# Patient Record
Sex: Male | Born: 1990 | Race: Black or African American | Hispanic: No | Marital: Single
Health system: Southern US, Community
[De-identification: ages and names within clinical notes are randomized; demographics above are authoritative.]

## PROBLEM LIST (undated history)

## (undated) ENCOUNTER — Ambulatory Visit (HOSPITAL_COMMUNITY): Discharge status: Absent without leave | Payer: Self-pay

---
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
## 2009-07-20 ENCOUNTER — Emergency Department (HOSPITAL_COMMUNITY): Admission: EM | Admit: 2009-07-20 | Discharge: 2009-07-20 | Payer: Self-pay | Admitting: Family Medicine

## 2009-12-10 IMAGING — CR DG KNEE COMPLETE 4+V*R*
4 series · 4 of 4 positions shown · non-contrast
Comparison: none

CLINICAL DATA: Knee pain status post fall.
 RIGHT KNEE ? 4 VIEWS ? 08/01/07:

[t knee ap right]
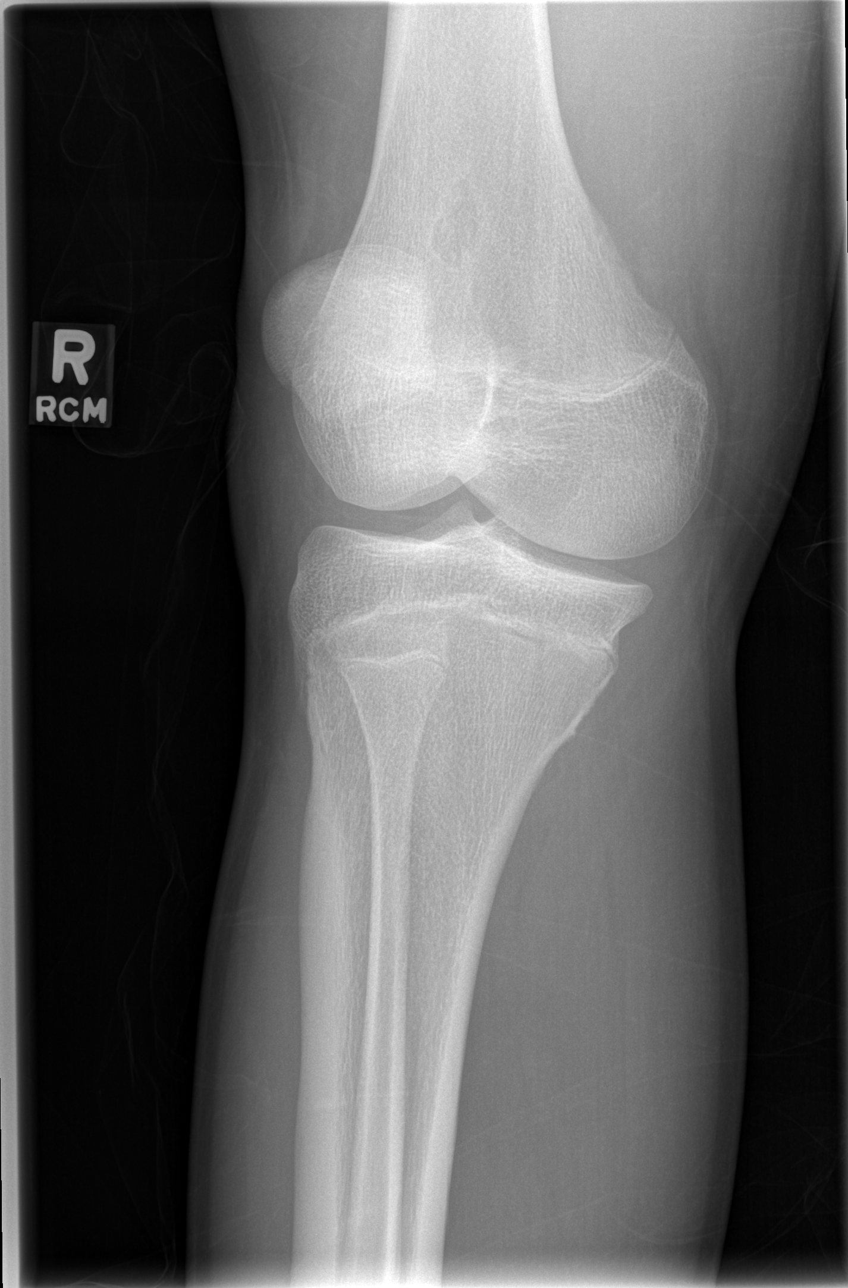

[t knee oblique right (1 of 2)]
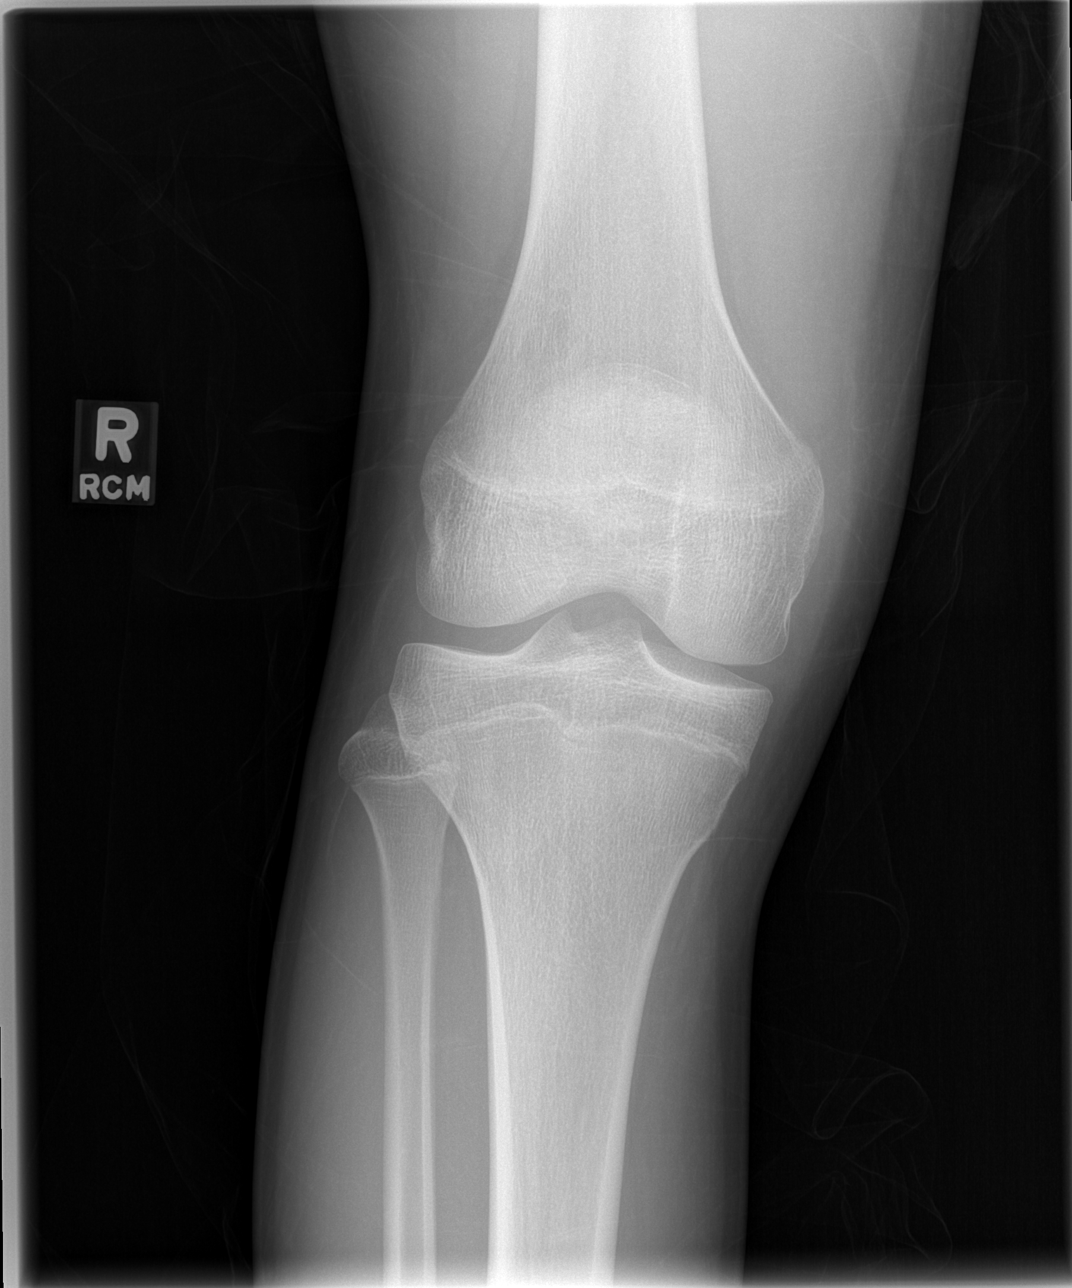

[t knee oblique right (2 of 2)]
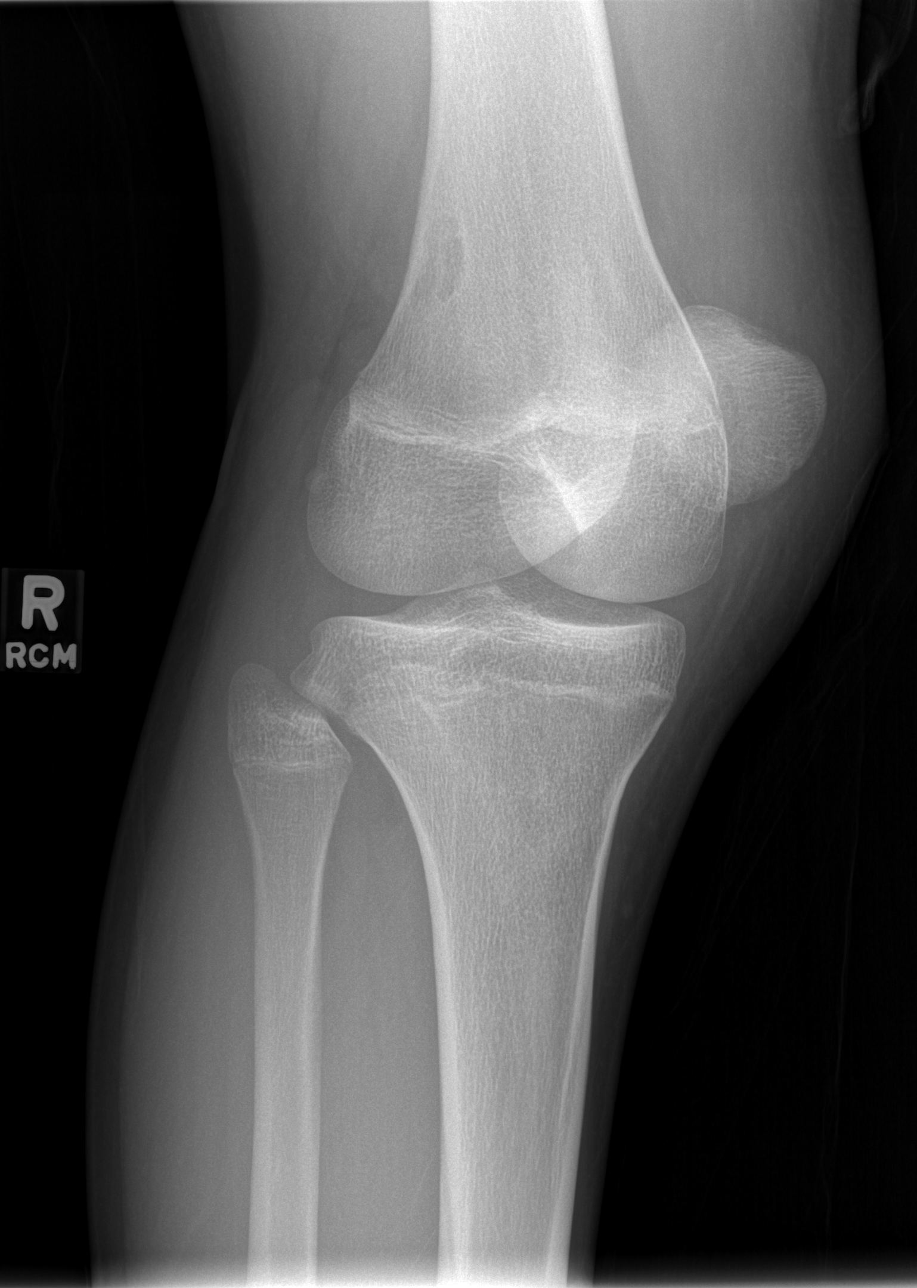

[t knee lat right]
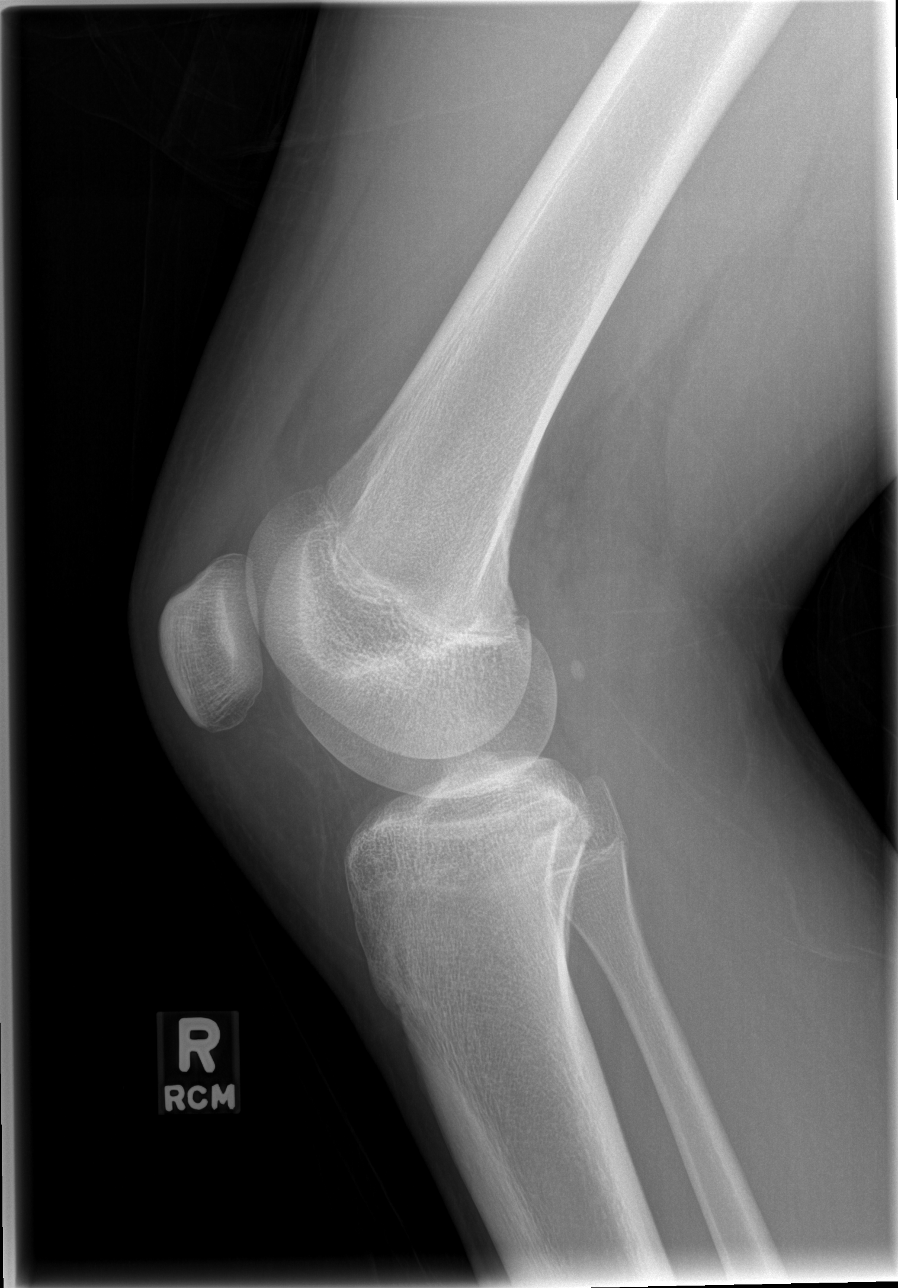

[4 of 4 positions shown; findings below may reference images not displayed]

FINDINGS: There is no evidence of fracture or dislocation.  No other soft tissue or bone abnormalities are identified.  There is no evidence of joint effusion.
IMPRESSION: Negative.

## 2010-08-18 NOTE — Progress Notes (Signed)
Summary: Triage  Phone Note Call from Patient Call back at [phone]   Reason for Call: Talk to Nurse Summary of Call: Is requesting to speak with a nurse about it burning when he urinates. Initial call taken by: Haydee Salter,  September 19, 2007 8:53 AM  Follow-up for Phone Call        appt made for this afternoon. urged him to drink plenty of fluids and may take tyl or ibu for pain Follow-up by: Golden Circle RN,  September 19, 2007 8:56 AM

## 2010-08-18 NOTE — Miscellaneous (Signed)
Summary: dnka/ts  Clinical Lists Changes 

## 2010-10-27 LAB — GC/CHLAMYDIA PROBE AMP, GENITAL
Chlamydia, DNA Probe: NEGATIVE
GC Probe Amp, Genital: NEGATIVE

## 2010-10-28 LAB — GC/CHLAMYDIA PROBE AMP, GENITAL
Chlamydia, DNA Probe: NEGATIVE
GC Probe Amp, Genital: NEGATIVE

## 2011-04-07 LAB — CULTURE, ROUTINE-ABSCESS

## 2011-04-08 LAB — HEPATITIS B SURFACE ANTIGEN: Hepatitis B Surface Ag: NEGATIVE

## 2011-04-08 LAB — GC/CHLAMYDIA PROBE AMP, GENITAL
Chlamydia, DNA Probe: NEGATIVE
GC Probe Amp, Genital: NEGATIVE

## 2011-04-18 LAB — RPR: RPR Ser Ql: NONREACTIVE

## 2011-04-18 LAB — HIV ANTIBODY (ROUTINE TESTING W REFLEX): HIV: NONREACTIVE

## 2011-04-18 LAB — GC/CHLAMYDIA PROBE AMP, GENITAL
Chlamydia, DNA Probe: POSITIVE — AB
GC Probe Amp, Genital: NEGATIVE

## 2012-04-13 ENCOUNTER — Emergency Department (HOSPITAL_COMMUNITY)
Admission: EM | Admit: 2012-04-13 | Discharge: 2012-04-13 | Discharge status: Absent without leave | Payer: Self-pay | Source: Home / Self Care | Attending: Family Medicine | Admitting: Family Medicine

## 2012-06-28 ENCOUNTER — Emergency Department (HOSPITAL_COMMUNITY)
Admission: EM | Admit: 2012-06-28 | Discharge: 2012-06-28 | Disposition: A | Discharge status: Home / Self Care | Payer: Self-pay | Source: Home / Self Care

## 2012-06-28 NOTE — ED Notes (Signed)
Pt called in all waiting areas; no answer x 3 

## 2012-06-28 NOTE — ED Notes (Signed)
Pt called in all waiting areas; no answer x 1 

## 2012-06-28 NOTE — ED Notes (Signed)
Pt called in all waiting areas and outside; no answer x 2

## 2019-03-12 ENCOUNTER — Emergency Department (HOSPITAL_COMMUNITY)
Admission: EM | Admit: 2019-03-12 | Discharge: 2019-03-12 | Disposition: A | Discharge status: Home / Self Care | Payer: HRSA Program | Attending: Emergency Medicine | Admitting: Emergency Medicine

## 2019-03-12 ENCOUNTER — Other Ambulatory Visit: Payer: Self-pay

## 2019-03-12 ENCOUNTER — Encounter (HOSPITAL_COMMUNITY): Payer: Self-pay | Admitting: *Deleted

## 2019-03-12 DIAGNOSIS — R0981 Nasal congestion: Secondary | ICD-10-CM | POA: Diagnosis not present

## 2019-03-12 DIAGNOSIS — Z20828 Contact with and (suspected) exposure to other viral communicable diseases: Secondary | ICD-10-CM | POA: Diagnosis present

## 2019-03-12 DIAGNOSIS — R05 Cough: Secondary | ICD-10-CM | POA: Diagnosis not present

## 2019-03-12 DIAGNOSIS — Z87891 Personal history of nicotine dependence: Secondary | ICD-10-CM | POA: Insufficient documentation

## 2019-03-12 DIAGNOSIS — J069 Acute upper respiratory infection, unspecified: Secondary | ICD-10-CM

## 2019-03-12 DIAGNOSIS — J029 Acute pharyngitis, unspecified: Secondary | ICD-10-CM | POA: Diagnosis not present

## 2019-03-12 DIAGNOSIS — F1290 Cannabis use, unspecified, uncomplicated: Secondary | ICD-10-CM | POA: Diagnosis not present

## 2019-03-12 NOTE — Discharge Instructions (Signed)
Please review the attachment and return to the ER for any new or worsening symptoms. Otherwise, please be sure to follow-up with your PCP for continued management.   °

## 2019-03-12 NOTE — ED Provider Notes (Signed)
[facility] EMERGENCY DEPARTMENT Provider Note   CSN: [id number] Arrival date & time: 03/12/19  1601     History   Chief Complaint Chief Complaint  Patient presents with  . URI    HPI Joshua Joyce is a 28 y.o. male with no significant past medical history who presents to the ER with a 2-day history of runny nose, sore throat, occasional nausea, and productive cough with concerns that he might have contracted Covid-19. Patient denies any shortness of breath, chest pain, headache, fever, or chills. His sputum has not changed character. Patient states that DayQuil and NyQuil have helped alleviate his symptoms and his sleep is undisturbed. Patient denies any aggravating factors. He smokes cannabis but denies any tobacco use. Patient lives with his sister who has not exhibited any symptoms and he has no known sick contacts at work.     HPI  History reviewed. No pertinent past medical history.  Patient Active Problem List   Diagnosis Date Noted  . DYSURIA 09/19/2007  . KNEE PAIN, LEFT 07/17/2007  . CHALAZION 09/14/2006    History reviewed. No pertinent surgical history.      Home Medications    Prior to Admission medications   Medication Sig Start Date End Date Taking? Authorizing Provider  doxycycline (ADOXA) 100 MG tablet Take 1 tablet by mouth two times a day for 7 days     [provider]    Family History History reviewed. No pertinent family history.  Social History Social History   Tobacco Use  . Smoking status: Former Smoker  Substance Use Topics  . Alcohol use: Not on file  . Drug use: Yes    Types: Marijuana     Allergies   Patient has no known allergies.   Review of Systems Review of Systems  All other systems reviewed and are negative.    Physical Exam Updated Vital Signs BP 136/76 (BP Location: Right Arm)   Pulse 68   Temp 98.5 F (36.9 C) (Oral)   Resp 16   SpO2 99%   Physical Exam Vitals signs and  nursing note reviewed.  Constitutional:      Appearance: Normal appearance.  HENT:     Head: Normocephalic and atraumatic.     Nose: Congestion and rhinorrhea present.     Mouth/Throat:     Pharynx: Oropharynx is clear. No oropharyngeal exudate or posterior oropharyngeal erythema.     Comments: No tonsillar hypertrophy.  Eyes:     Pupils: Pupils are equal, round, and reactive to light.  Cardiovascular:     Rate and Rhythm: Normal rate and regular rhythm.     Heart sounds: No murmur. No friction rub. No gallop.   Pulmonary:     Effort: Pulmonary effort is normal. No respiratory distress.     Breath sounds: Normal breath sounds. No stridor. No wheezing, rhonchi or rales.  Abdominal:     General: Abdomen is flat. Bowel sounds are normal.     Palpations: Abdomen is soft.  Neurological:     Mental Status: He is alert and oriented to person, place, and time.  Psychiatric:        Mood and Affect: Mood normal.        Behavior: Behavior normal.        Thought Content: Thought content normal.      ED Treatments / Results  Labs (all labs ordered are listed, but only abnormal results are displayed) Labs Reviewed  NOVEL CORONAVIRUS, NAA (HOSP  ORDER, SEND-OUT TO REF LAB; TAT 18-24 HRS)    EKG None  Radiology No results found.  Procedures Procedures (including critical care time)  Medications Ordered in ED Medications - No data to display   Initial Impression / Assessment and Plan / ED Course  I have reviewed the triage vital signs and the nursing notes.  Pertinent labs & imaging results that were available during my care of the patient were reviewed by me and considered in my medical decision making (see chart for details).      I believe that patient has an upper respiratory infection, likely viral etiology. Patient's vitals are reassuring and he does not exhibit any respiratory distress. Patient has no significant PMH and do not feel as though he needs to be monitored or  admitted at this time. Do not suspect pneumonia due to brevity of illness, lack of risk factors, no fever/chills, and no abnormal lung sounds. Collected nasopharyngeal covid test. Instructed patient to continue symptomatic management at home and to abstain from smoking cannabis, at least until symptoms. Patient voiced no other questions or concerns. Strict return precautions provided.   Joshua Michaelserrence Joshua Joyce was evaluated in Emergency Department on 03/12/2019 for the symptoms described in the history of present illness. He was evaluated in the context of the global COVID-19 pandemic, which necessitated consideration that the patient might be at risk for infection with the SARS-CoV-2 virus that causes COVID-19. Institutional protocols and algorithms that pertain to the evaluation of patients at risk for COVID-19 are in a state of rapid change based on information released by regulatory bodies including the CDC and federal and state organizations. These policies and algorithms were followed during the patient's care in the ED.   Final Clinical Impressions(s) / ED Diagnoses   Final diagnoses:  Upper respiratory tract infection, unspecified type    ED Discharge Orders    None       Lorelee NewGreen, Joshua Zehring L, PA-C 03/12/19 [id number]    Pricilla LovelessGoldston, Scott, MD 03/12/19 [phone]

## 2019-03-12 NOTE — ED Triage Notes (Signed)
Pt is concerned that he has covid. Reports cold symptoms since yesterday. Has runny nose, sore throat and cough. Denies fever. No acute distress noted and mask on pt on arrival.

## 2019-03-13 LAB — NOVEL CORONAVIRUS, NAA (HOSP ORDER, SEND-OUT TO REF LAB; TAT 18-24 HRS): SARS-CoV-2, NAA: NOT DETECTED

## 2019-05-17 ENCOUNTER — Other Ambulatory Visit: Payer: Self-pay

## 2019-05-17 ENCOUNTER — Encounter (HOSPITAL_COMMUNITY): Payer: Self-pay

## 2019-05-17 ENCOUNTER — Ambulatory Visit (HOSPITAL_COMMUNITY)
Admission: EM | Admit: 2019-05-17 | Discharge: 2019-05-17 | Disposition: A | Discharge status: Home / Self Care | Payer: Self-pay | Attending: Emergency Medicine | Admitting: Emergency Medicine

## 2019-05-17 DIAGNOSIS — Z202 Contact with and (suspected) exposure to infections with a predominantly sexual mode of transmission: Secondary | ICD-10-CM

## 2019-05-17 DIAGNOSIS — Z113 Encounter for screening for infections with a predominantly sexual mode of transmission: Secondary | ICD-10-CM

## 2019-05-17 MED ORDER — ONDANSETRON 4 MG PO TBDP
8.0000 mg | ORAL_TABLET | Freq: Once | ORAL | Status: AC
  Administered 2019-05-17: 17:00:00 8 mg via ORAL

## 2019-05-17 MED ORDER — CEFTRIAXONE SODIUM 250 MG IJ SOLR
250.0000 mg | Freq: Once | INTRAMUSCULAR | Status: AC
  Administered 2019-05-17: 17:00:00 250 mg via INTRAMUSCULAR

## 2019-05-17 MED ORDER — METRONIDAZOLE 500 MG PO TABS
ORAL_TABLET | ORAL | Status: AC
  Filled 2019-05-17: qty 4

## 2019-05-17 MED ORDER — CEFTRIAXONE SODIUM 250 MG IJ SOLR
INTRAMUSCULAR | Status: AC
  Filled 2019-05-17: qty 250

## 2019-05-17 MED ORDER — ONDANSETRON 4 MG PO TBDP
ORAL_TABLET | ORAL | Status: AC
  Filled 2019-05-17: qty 1

## 2019-05-17 MED ORDER — AZITHROMYCIN 250 MG PO TABS
1000.0000 mg | ORAL_TABLET | Freq: Once | ORAL | Status: AC
  Administered 2019-05-17: 1000 mg via ORAL

## 2019-05-17 MED ORDER — AZITHROMYCIN 250 MG PO TABS
ORAL_TABLET | ORAL | Status: AC
  Filled 2019-05-17: qty 4

## 2019-05-17 MED ORDER — METRONIDAZOLE 500 MG PO TABS
2000.0000 mg | ORAL_TABLET | Freq: Once | ORAL | Status: AC
  Administered 2019-05-17: 17:00:00 2000 mg via ORAL

## 2019-05-17 NOTE — ED Provider Notes (Signed)
HPI  SUBJECTIVE:  Joshua Joyce is a 28 y.o. male who presents with 2 days of tingling in his penis after having unprotected sex 1 1/2 weeks ago with a new male partner.  Denies dysuria, urgency, frequency, cloudy or odorous urine.  No penile rash, discharge, itching. No Testicular pain, swelling.  No abdominal, back, pelvic pain.  States that he has 22 male sexual partners total, and uses condoms intermittently with them.  He states that they are asymptomatic to his knowledge.  No aggravating or alleviating factors.  He has not tried anything for this.  He has a past medical history of gonorrhea, chlamydia.  No history of HIV, HSV, syphilis, trichomonas, diabetes.  PMD: None.    History reviewed. No pertinent past medical history.  History reviewed. No pertinent surgical history.  Family History  Family history unknown: Yes    Social History   Tobacco Use  . Smoking status: Former Smoker  Substance Use Topics  . Alcohol use: Not on file  . Drug use: Yes    Types: Marijuana     Current Facility-Administered Medications:  .  azithromycin (ZITHROMAX) tablet 1,000 mg, 1,000 mg, Oral, Once, Melynda Ripple, MD .  cefTRIAXone (ROCEPHIN) injection 250 mg, 250 mg, Intramuscular, Once, Melynda Ripple, MD .  metroNIDAZOLE (FLAGYL) tablet 2,000 mg, 2,000 mg, Oral, Once, Melynda Ripple, MD .  ondansetron (ZOFRAN-ODT) disintegrating tablet 8 mg, 8 mg, Oral, Once, Melynda Ripple, MD No current outpatient medications on file.  No Known Allergies   ROS  As noted in HPI.   Physical Exam  BP 136/65 (BP Location: Right Arm)   Pulse (!) 59   Temp 98.7 F (37.1 C) (Oral)   Resp 18   SpO2 99%   Constitutional: Well developed, well nourished, no acute distress Eyes:  EOMI, conjunctiva normal bilaterally HENT: Normocephalic, atraumatic,mucus membranes moist Respiratory: Normal inspiratory effort Cardiovascular: Normal rate GI: nondistended GU: Normal circumcised  male, no rash, discharge.  Patient declined chaperone. skin: No rash, skin intact Musculoskeletal: no deformities Neurologic: Alert & oriented x 3, no focal neuro deficits Psychiatric: Speech and behavior appropriate   ED Course   Medications  metroNIDAZOLE (FLAGYL) tablet 2,000 mg (has no administration in time range)  ondansetron (ZOFRAN-ODT) disintegrating tablet 8 mg (has no administration in time range)  azithromycin (ZITHROMAX) tablet 1,000 mg (has no administration in time range)  cefTRIAXone (ROCEPHIN) injection 250 mg (has no administration in time range)    No orders of the defined types were placed in this encounter.   No results found for this or any previous visit (from the past 24 hour(s)). No results found.  ED Clinical Impression  1. Screening for STD (sexually transmitted disease)      ED Assessment/Plan  Gonorrhea, chlamydia, trichomonas sent.  He declined HIV and syphilis testing states that he got checked for this last week.  He is requesting treatment for STDs, will give him Rocephin 250 mg IM, 1 g of azithromycin p.o., 2 g of Flagyl p.o. and 8 mg of Zofran.  Follow-up with a primary care physician of his choice.  Will provide list.  Meds ordered this encounter  Medications  . metroNIDAZOLE (FLAGYL) tablet 2,000 mg  . ondansetron (ZOFRAN-ODT) disintegrating tablet 8 mg  . azithromycin (ZITHROMAX) tablet 1,000 mg  . cefTRIAXone (ROCEPHIN) injection 250 mg    Order Specific Question:   Antibiotic Indication:    Answer:   STD    *This clinic note was created using Dragon dictation software.  Therefore, there may be occasional mistakes despite careful proofreading.   ?    Domenick Gong, MD 05/18/19 [phone]

## 2019-05-17 NOTE — Discharge Instructions (Addendum)
Give [place] a working phone number so that we can contact you if needed. Refrain from sexual contact until you know your results and your partner(s) are treated if necessary.   Below is a list of primary care practices who are taking new patients for you to follow-up with.  [facility] at [facility] [address] [place], [place] [address] [phone]  [place],  [address] [phone]  [facility] [phone] [place] [address]  [facility]: [place]  [phone]  [facility]: [place]   [phone]  [facility]: [address] [place]  [phone]  [place] primary care : [address] [phone]  [facility]: [address] [place] [phone]  [facility]: [place] [phone]  Dr. Blanchie Serve [place]  [phone]  Dr. Benito Mccreedy, [facility]. [place],  [address]  [phone]  Go to www.goodrx.com to look up your medications. This will give you a list of where you can find your prescriptions at the most affordable prices. Or ask the pharmacist what the cash price is, or if they have any other discount programs available to help make your medication more affordable. This can be less expensive than what you would pay with insurance.

## 2019-05-17 NOTE — ED Triage Notes (Signed)
Pt presents for STD testing; pt states he is not having any symptoms. 

## 2019-05-21 ENCOUNTER — Ambulatory Visit (HOSPITAL_COMMUNITY)
Admission: EM | Admit: 2019-05-21 | Discharge: 2019-05-21 | Disposition: A | Discharge status: Home / Self Care | Payer: Self-pay | Attending: Family | Admitting: Family

## 2019-05-21 ENCOUNTER — Encounter (HOSPITAL_COMMUNITY): Payer: Self-pay

## 2019-05-21 ENCOUNTER — Other Ambulatory Visit: Payer: Self-pay

## 2019-05-21 DIAGNOSIS — Z202 Contact with and (suspected) exposure to infections with a predominantly sexual mode of transmission: Secondary | ICD-10-CM

## 2019-05-21 DIAGNOSIS — R300 Dysuria: Secondary | ICD-10-CM | POA: Insufficient documentation

## 2019-05-21 DIAGNOSIS — R369 Urethral discharge, unspecified: Secondary | ICD-10-CM | POA: Insufficient documentation

## 2019-05-21 LAB — CYTOLOGY, (ORAL, ANAL, URETHRAL) ANCILLARY ONLY
Chlamydia: NEGATIVE
Neisseria Gonorrhea: NEGATIVE
Trichomonas: NEGATIVE

## 2019-05-21 MED ORDER — CEFTRIAXONE SODIUM 250 MG IJ SOLR
250.0000 mg | Freq: Once | INTRAMUSCULAR | Status: AC
  Administered 2019-05-21: 250 mg via INTRAMUSCULAR

## 2019-05-21 MED ORDER — AZITHROMYCIN 250 MG PO TABS
1000.0000 mg | ORAL_TABLET | Freq: Once | ORAL | Status: AC
  Administered 2019-05-21: 1000 mg via ORAL

## 2019-05-21 MED ORDER — AZITHROMYCIN 250 MG PO TABS
ORAL_TABLET | ORAL | Status: AC
  Filled 2019-05-21: qty 4

## 2019-05-21 MED ORDER — CEFTRIAXONE SODIUM 250 MG IJ SOLR
INTRAMUSCULAR | Status: AC
  Filled 2019-05-21: qty 250

## 2019-05-21 NOTE — Discharge Instructions (Addendum)
You were given a shot of Rocephin (antibiotic) and oral Zithromax (antibiotic) for Gonorrhea and Chlamydia. No sexual intercourse or oral sex for at least 7 days. Encouraged to use condoms with each and every sexual encounter. Follow-up pending lab results.

## 2019-05-21 NOTE — ED Provider Notes (Signed)
[facility]    CSN: [id number] Arrival date & time: 05/21/19  0813      History   Chief Complaint Chief Complaint  Patient presents with  . Exposure to STD    HPI Joshua Joyce is a 28 y.o. male.   28 year old male presents for STD testing and treatment. He was re-exposed to possible Chlamydia or Gonorrhea within the past 7 days. Now having some dysuria, urethral swelling and penile yellowish discharge. Denies any fever or distinct abdominal pain. He indicated that he was seen here over a week ago, was treated but had sexual intercourse again without protection with the same partner that was positive for an STD within the past few days. There is no record of his visit or any labwork within a [facility] facility in the past 7 years. Confirmed his name, date of birth and identifying information and still did not bring up any record of recent visits- uncertain where he went for care. No other chronic health issues. Takes no daily medication.   The history is provided by the patient.    History reviewed. No pertinent past medical history.  There are no active problems to display for this patient.   History reviewed. No pertinent surgical history.     Home Medications    Prior to Admission medications   Not on File    Family History Family History  Family history unknown: Yes    Social History Social History   Tobacco Use  . Smoking status: Never Smoker  . Smokeless tobacco: Never Used  Substance Use Topics  . Alcohol use: Not on file  . Drug use: Not on file     Allergies   Patient has no known allergies.   Review of Systems Review of Systems  Constitutional: Negative for activity change, appetite change, chills, fatigue and fever.  HENT: Negative for mouth sores, sore throat and trouble swallowing.   Respiratory: Negative for cough, chest tightness, shortness of breath and wheezing.   Gastrointestinal: Negative for abdominal pain, blood in  stool, constipation, nausea and vomiting.  Genitourinary: Positive for decreased urine volume, discharge, dysuria and penile swelling. Negative for difficulty urinating, flank pain, frequency, genital sores, hematuria, penile pain, scrotal swelling, testicular pain and urgency.  Musculoskeletal: Negative for arthralgias and myalgias.  Skin: Negative for color change, rash and wound.  Allergic/Immunologic: Negative for environmental allergies, food allergies and immunocompromised state.  Neurological: Negative for dizziness, tremors, seizures, syncope, weakness, light-headedness, numbness and headaches.  Hematological: Negative for adenopathy. Does not bruise/bleed easily.     Physical Exam Triage Vital Signs ED Triage Vitals [05/21/19 0846]  Enc Vitals Group     BP 119/74     Pulse Rate 61     Resp 18     Temp 98.7 F (37.1 C)     Temp Source Oral     SpO2 99 %     Weight      Height      Head Circumference      Peak Flow      Pain Score 6     Pain Loc      Pain Edu?      Excl. in GC?    No data found.  Updated Vital Signs BP 119/74 (BP Location: Left Arm)   Pulse 61   Temp 98.7 F (37.1 C) (Oral)   Resp 18   SpO2 99%   Visual Acuity Right Eye Distance:   Left Eye Distance:  Bilateral Distance:    Right Eye Near:   Left Eye Near:    Bilateral Near:     Physical Exam Vitals signs and nursing note reviewed. Chaperone present: Patient declined chaperone.  Constitutional:      General: He is awake. He is not in acute distress.    Appearance: He is well-developed and well-groomed. He is not ill-appearing.     Comments: He is sitting comfortably on the exam table in no acute distress but is preoccupied with 2 cell phones during most of exam.   HENT:     Head: Normocephalic and atraumatic.  Eyes:     Extraocular Movements: Extraocular movements intact.     Conjunctiva/sclera: Conjunctivae normal.  Neck:     Musculoskeletal: Normal range of motion.   Cardiovascular:     Rate and Rhythm: Normal rate and regular rhythm.     Heart sounds: Normal heart sounds. No murmur.  Pulmonary:     Effort: Pulmonary effort is normal. No respiratory distress.     Breath sounds: Normal breath sounds and air entry. No decreased air movement. No decreased breath sounds, wheezing or rhonchi.  Abdominal:     General: Abdomen is flat. Bowel sounds are normal. There is no distension.     Palpations: Abdomen is soft. There is no mass.     Tenderness: There is no abdominal tenderness. There is no right CVA tenderness, left CVA tenderness, guarding or rebound.     Hernia: There is no hernia in the left inguinal area or right inguinal area.  Genitourinary:    Pubic Area: No rash.      Penis: Circumcised. Discharge (slight white to yellowish) and swelling present. No erythema, tenderness or lesions.      Scrotum/Testes: Normal.  Musculoskeletal: Normal range of motion.  Lymphadenopathy:     Lower Body: No right inguinal adenopathy. No left inguinal adenopathy.  Skin:    General: Skin is warm and dry.     Findings: No rash.  Neurological:     General: No focal deficit present.     Mental Status: He is alert and oriented to person, place, and time.  Psychiatric:        Mood and Affect: Mood normal.        Speech: Speech normal.        Behavior: Behavior is cooperative.     Comments: Patient distracted during most of the exam.       UC Treatments / Results  Labs (all labs ordered are listed, but only abnormal results are displayed) Labs Reviewed  CYTOLOGY, (ORAL, ANAL, URETHRAL) ANCILLARY ONLY    EKG   Radiology No results found.  Procedures Procedures (including critical care time)  Medications Ordered in UC Medications  azithromycin (ZITHROMAX) tablet 1,000 mg (1,000 mg Oral Given 05/21/19 0941)  cefTRIAXone (ROCEPHIN) injection 250 mg (250 mg Intramuscular Given 05/21/19 0941)  azithromycin (ZITHROMAX) 250 MG tablet (has no administration  in time range)  cefTRIAXone (ROCEPHIN) 250 MG injection (has no administration in time range)    Initial Impression / Assessment and Plan / UC Course  I have reviewed the triage vital signs and the nursing notes.  Pertinent labs & imaging results that were available during my care of the patient were reviewed by me and considered in my medical decision making (see chart for details).    Patient unable to get a urine specimen for urinalysis. Obtained urethral swab for STD testing. Gave oral Zithromax 1 g now and Rocephin  250mg  IM now. Discussed that he needs to avoid any sexual intercourse or oral sex for at least 7 days. Encouraged to use condoms with each and every future sexual encounter. Follow-up pending lab results.   Final Clinical Impressions(s) / UC Diagnoses   Final diagnoses:  Exposure to STD  Dysuria  Penile discharge     Discharge Instructions     You were given a shot of Rocephin (antibiotic) and oral Zithromax (antibiotic) for Gonorrhea and Chlamydia. No sexual intercourse or oral sex for at least 7 days. Encouraged to use condoms with each and every sexual encounter. Follow-up pending lab results.     ED Prescriptions    None     PDMP not reviewed this encounter.   , NP 05/21/19 1706

## 2019-05-21 NOTE — ED Triage Notes (Signed)
Pt presents for treatment of STD after re exposure after getting treated on last visit; pt states he had sex with the same partner before 7 days.

## 2019-05-22 LAB — CYTOLOGY, (ORAL, ANAL, URETHRAL) ANCILLARY ONLY
Chlamydia: NEGATIVE
Neisseria Gonorrhea: NEGATIVE
Trichomonas: NEGATIVE

## 2019-07-22 ENCOUNTER — Ambulatory Visit (HOSPITAL_COMMUNITY)
Admission: EM | Admit: 2019-07-22 | Discharge: 2019-07-22 | Disposition: A | Discharge status: Home / Self Care | Payer: Self-pay

## 2019-07-22 NOTE — ED Notes (Signed)
Patient was cursing loudly on his phone while waiting for registration. Patient access asked the patient politely to not curse in front of the other patients, at which point the patient left.

## 2019-11-19 ENCOUNTER — Emergency Department (HOSPITAL_COMMUNITY)
Admission: EM | Admit: 2019-11-19 | Discharge: 2019-11-19 | Discharge status: Against Medical Advice | Payer: Self-pay | Attending: Emergency Medicine | Admitting: Emergency Medicine

## 2019-11-19 ENCOUNTER — Encounter (HOSPITAL_COMMUNITY): Payer: Self-pay | Admitting: Emergency Medicine

## 2019-11-19 ENCOUNTER — Other Ambulatory Visit: Payer: Self-pay

## 2019-11-19 DIAGNOSIS — R519 Headache, unspecified: Secondary | ICD-10-CM | POA: Insufficient documentation

## 2019-11-19 DIAGNOSIS — Z87891 Personal history of nicotine dependence: Secondary | ICD-10-CM | POA: Insufficient documentation

## 2019-11-19 DIAGNOSIS — Z5320 Procedure and treatment not carried out because of patient's decision for unspecified reasons: Secondary | ICD-10-CM | POA: Insufficient documentation

## 2019-11-19 MED ORDER — ACETAMINOPHEN 325 MG PO TABS
650.0000 mg | ORAL_TABLET | Freq: Once | ORAL | Status: AC
  Administered 2019-11-19: 650 mg via ORAL
  Filled 2019-11-19: qty 2

## 2019-11-19 NOTE — ED Notes (Addendum)
While this RN was rooming another patient, this patient was seen leaving unit through double doors toward lobby. This RN called pt's cell phone to confirm he left, he said "Yeah, I have to take my daughter to school" and hung up the phone. PA informed pt had eloped from ED.

## 2019-11-19 NOTE — ED Notes (Signed)
Patient informed PA that he had to leave because he needed to be home by 8PM because he is under house arrest. Patient left AMA.

## 2019-11-19 NOTE — ED Triage Notes (Signed)
Pt here to be seen for HA x 5days came earlier and had to leave to pick up daughter.

## 2019-11-19 NOTE — ED Provider Notes (Addendum)
[facility] EMERGENCY DEPARTMENT Provider Note   CSN: [id number] Arrival date & time: 11/19/19  1113     History Chief Complaint  Patient presents with  . Headache    Joshua Joyce is a 29 y.o. male.  HPI   29 year old male presenting to the emergency department today for evaluation of right-sided headache that has been ongoing intermittently for the last 5 days.  States that at its worst the pain is rated a 10/10 however currently the pain is rated at 7/10.  States episodes of pain last for about an hour at a time.  He has been taking Advil PMs which do help with his symptoms.  He denies any current visual changes but does state that a few days ago when he had the headache he had blurred vision temporarily in the right eye which lasted for a few minutes and resolved spontaneously.  He denies any recent head trauma, dizziness, lightheadedness, nausea, vomiting, photosensitivity, numbness/weakness.  Denies any recent URI symptoms, body aches or fevers.  Denies any recent Covid exposures.  Denies any family history of brain aneurysms.  He is also concerned that he has some redness to the right eye.  Does state that he smokes marijuana and sometimes coughs very hard when he smokes this. He does not have any eye pain, itching, or other eye sxs.  History reviewed. No pertinent past medical history.  Patient Active Problem List   Diagnosis Date Noted  . DYSURIA 09/19/2007  . KNEE PAIN, LEFT 07/17/2007  . CHALAZION 09/14/2006    History reviewed. No pertinent surgical history.     Family History  Family history unknown: Yes    Social History   Tobacco Use  . Smoking status: Former Smoker  Substance Use Topics  . Alcohol use: Not on file  . Drug use: Yes    Types: Marijuana    Home Medications Prior to Admission medications   Medication Sig Start Date End Date Taking? Authorizing Provider  doxycycline (ADOXA) 100 MG tablet Take 1 tablet by mouth two  times a day for 7 days   05/17/19  [provider]    Allergies    Patient has no known allergies.  Review of Systems   Review of Systems  Constitutional: Negative for fever.  HENT: Negative for congestion.   Eyes: Positive for redness and visual disturbance (resolved). Negative for photophobia, pain, discharge and itching.  Respiratory: Negative for cough and shortness of breath.   Cardiovascular: Negative for chest pain.  Gastrointestinal: Negative for abdominal pain, nausea and vomiting.  Musculoskeletal: Negative for back pain and neck pain.  Neurological: Positive for headaches. Negative for dizziness, speech difficulty, weakness, light-headedness and numbness.    Physical Exam Updated Vital Signs BP 134/60 (BP Location: Right Arm)   Pulse 65   Temp 98.1 F (36.7 C) (Oral)   Resp 16   SpO2 100%   Physical Exam Vitals and nursing note reviewed.  Constitutional:      Appearance: He is well-developed.  HENT:     Head: Normocephalic and atraumatic.  Eyes:     Conjunctiva/sclera: Conjunctivae normal.  Cardiovascular:     Rate and Rhythm: Normal rate and regular rhythm.  Pulmonary:     Effort: Pulmonary effort is normal.  Abdominal:     Palpations: Abdomen is soft.  Musculoskeletal:     Cervical back: Neck supple.  Skin:    General: Skin is warm and dry.  Neurological:     Mental  Status: He is alert.     Comments: Mental Status:  Alert, thought content appropriate, able to give a coherent history. Speech fluent without evidence of aphasia. Able to follow 2 step commands without difficulty.  Cranial Nerves:  II:  pupils equal, round, reactive to light III,IV, VI: ptosis not present, extra-ocular motions intact bilaterally  V,VII: smile symmetric, facial light touch sensation equal VIII: hearing grossly normal to voice  X: uvula elevates symmetrically  XI: bilateral shoulder shrug symmetric and strong XII: midline tongue extension without  fassiculations Motor:  Normal tone. 5/5 strength of BUE and BLE major muscle groups including strong and equal grip strength and dorsiflexion/plantar flexion Sensory: light touch normal in all extremities. Gait: normal gait and balance.      ED Results / Procedures / Treatments   Labs (all labs ordered are listed, but only abnormal results are displayed) Labs Reviewed - No data to display  EKG None  Radiology No results found.  Procedures Procedures (including critical care time)  Medications Ordered in ED Medications  acetaminophen (TYLENOL) tablet 650 mg (650 mg Oral Given 11/19/19 1223)    ED Course  I have reviewed the triage vital signs and the nursing notes.  Pertinent labs & imaging results that were available during my care of the patient were reviewed by me and considered in my medical decision making (see chart for details).    MDM Rules/Calculators/A&P                      29 year old male presenting for evaluation of right-sided headache ongoing intermittently for 5 days.  Also reports intermittent vision changes.  Headache improves with Motrin.  He does not have any associated neuro complaints and denies any history of trauma.  On exam, his neuro exam is completely benign.  He was also complaining of some redness to the right eye and it looks like he has a subconjunctival hemorrhage.  He denies any trauma to the eye but does state that he has had some forceful coughing smoking marijuana which I suspect is the etiology of his symptoms.  He does not have any pain to suggest that he may have a corneal abrasion.  I discussed that I would give him some pain medications.  I discussed that because of his intermittent visual changes we may need to get some imaging of his head.  I also discussed that we would give him follow-up with a neurologist.  He was in agreement with this plan.  1:34 PM I was informed by nursing staff that the patient eloped from the ED.  She contacted  the patient and he stated that he had to pick his daughter up from school.  His head CT was not completed and I was not able to give him any discharge paperwork to refer him to neurology.  Final Clinical Impression(s) / ED Diagnoses Final diagnoses:  Headache disorder    Rx / DC Orders ED Discharge Orders    None       Rodney Booze, PA-C 11/19/19 1335    Rodney Booze, PA-C 11/19/19 1336    Maudie Flakes, MD 11/21/19 1120

## 2019-11-19 NOTE — ED Triage Notes (Signed)
Pt reports a headache on the right temporal lobe for 5 days, pt denies any associated symptoms no nausea or vision change. Pt is alert and ox4, no neuro deficits. Pt woke up this morning and had red spot in right eye- denies any pain.

## 2019-11-19 NOTE — ED Provider Notes (Signed)
[facility] EMERGENCY DEPARTMENT Provider Note   CSN: [id number] Arrival date & time: 11/19/19  1734    History Chief Complaint  Patient presents with   Headache   Joshua Joyce is a 29 y.o. male with no significant past medical history who presents for evaluation of headache.  Headache intermittent in nature on right side of head over the last 5 days.  Currently rated a 5/10.  Has been taking Advil PM which helps with the symptoms.  Reportedly had blurry vision 3 days ago which resolved after a few minutes spontaneously.  He denies any current vision changes, lightheadedness, dizziness.  No recent head trauma or injury.  No associated photophobia or phonophobia.  No nausea, vomiting, facial droop, paresthesias, lateral weakness.  Denies aggravating relieving factors.  No neck pain, body aches, fever.  No recent Covid exposures.  Denies sudden onset thunderclap headache.  He was seen here earlier today and eloped.  Patient states he is post to have a CT scan done and he left prior to this.  HPI     History reviewed. No pertinent past medical history.  Patient Active Problem List   Diagnosis Date Noted   DYSURIA 09/19/2007   KNEE PAIN, LEFT 07/17/2007   CHALAZION 09/14/2006    No past surgical history on file.     Family History  Family history unknown: Yes    Social History   Tobacco Use   Smoking status: Former Smoker  Substance Use Topics   Alcohol use: Not on file   Drug use: Yes    Types: Marijuana    Home Medications Prior to Admission medications   Medication Sig Start Date End Date Taking? Authorizing Provider  acetaminophen (TYLENOL) 500 MG tablet Take 500 mg by mouth every 6 (six) hours as needed for mild pain.   Yes [provider]  doxycycline (ADOXA) 100 MG tablet Take 1 tablet by mouth two times a day for 7 days   05/17/19  [provider]    Allergies    Patient has no known allergies.  Review of  Systems   Review of Systems  Constitutional: Negative.   HENT: Negative.   Eyes: Positive for visual disturbance (4 days ago, none currently).  Respiratory: Negative.   Cardiovascular: Negative.   Gastrointestinal: Negative.   Genitourinary: Negative.   Musculoskeletal: Negative.   Skin: Negative.   Neurological: Positive for headaches. Negative for dizziness, tremors, seizures, syncope, facial asymmetry, speech difficulty, weakness, light-headedness and numbness.  All other systems reviewed and are negative.   Physical Exam Updated Vital Signs BP 136/77 (BP Location: Right Arm)    Pulse 65    Temp 98.4 F (36.9 C) (Oral)    Resp 16    Ht 5\' 8"  (1.727 m)    Wt 88.5 kg    SpO2 100%    BMI 29.65 kg/m   Physical Exam Physical Exam  Constitutional: Pt is oriented to person, place, and time. Pt appears well-developed and well-nourished. No distress.  HENT:  Head: Normocephalic and atraumatic.  Mouth/Throat: Oropharynx is clear and moist.  Eyes:  EOM are normal. Pupils are equal, round, and reactive to light. No scleral icterus. Sclera erythema to right lateral eye. No horizontal, vertical or rotational nystagmus  Neck: Normal range of motion. Neck supple.  Full active and passive ROM without pain No midline or paraspinal tenderness No nuchal rigidity or meningeal signs  Cardiovascular: Normal rate, regular rhythm and intact distal pulses.   Pulmonary/Chest:  Effort normal and breath sounds normal. No respiratory distress. Pt has no wheezes. No rales.  Abdominal: Soft. Bowel sounds are normal. There is no tenderness. There is no rebound and no guarding.  Musculoskeletal: Normal range of motion.  Lymphadenopathy:    No cervical adenopathy.  Neurological: Pt. is alert and oriented to person, place, and time. He has normal reflexes. No cranial nerve deficit.  Exhibits normal muscle tone. Coordination normal.  Mental Status:  Alert, oriented, thought content appropriate. Speech fluent  without evidence of aphasia. Able to follow 2 step commands without difficulty.  Cranial Nerves:  II:  Peripheral visual fields grossly normal, pupils equal, round, reactive to light III,IV, VI: ptosis not present, extra-ocular motions intact bilaterally  V,VII: smile symmetric, facial light touch sensation equal VIII: hearing grossly normal bilaterally  IX,X: midline uvula rise  XI: bilateral shoulder shrug equal and strong XII: midline tongue extension  Motor:  5/5 in upper and lower extremities bilaterally including strong and equal grip strength and dorsiflexion/plantar flexion Sensory: Pinprick and light touch normal in all extremities.  Deep Tendon Reflexes: 2+ and symmetric  Cerebellar: normal finger-to-nose with bilateral upper extremities Gait: normal gait and balance CV: distal pulses palpable throughout   Skin: Skin is warm and dry. No rash noted. Pt is not diaphoretic.  Psychiatric: Pt has a normal mood and affect. Behavior is normal. Judgment and thought content normal.  Nursing note and vitals reviewed. ED Results / Procedures / Treatments   Labs (all labs ordered are listed, but only abnormal results are displayed) Labs Reviewed - No data to display  EKG None  Radiology No results found.  Procedures Procedures (including critical care time)  Medications Ordered in ED Medications - No data to display  ED Course  I have reviewed the triage vital signs and the nursing notes.  Pertinent labs & imaging results that were available during my care of the patient were reviewed by me and considered in my medical decision making (see chart for details).  29 year old male presents for evaluation of headache x5 days.  He was seen earlier today and eloped.  Was supposed to have CT scan done at that time.  States he did have blurred vision 4 days ago however none currently.  He denies any current eye pain.  Patient is nonfocal neuro exam without deficits.  On my initial  evaluation patient states he has to "leave."  Patient states he is under probation and has to be home by 8:00 or he will "be arrested."  He does have some scleral erythema to his right lateral eye however denies any eye pain, current vision changes, recent trauma.  Patient texting on phone throughout entire conversation.  Looks overall comfortable.  Patient intermittently compliant with exam.  Seems more concerned with texting.  States he has to leave prior to completion of exam.  Will leave [place].  We discussed the nature and purpose, risks and benefits, as well as, the alternatives of treatment. Time was given to allow the opportunity to ask questions and consider their options, and after the discussion, the patient decided to refuse the offerred treatment. The patient was informed that refusal could lead to, but was not limited to, death, permanent disability, or severe pain. If present, I asked the relatives or significant others to dissuade them without success. Prior to refusing, I determined that the patient had the capacity to make their decision and understood the consequences of that decision. After refusal, I made every reasonable  opportunity to treat them to the best of my ability.  The patient was notified that they may return to the emergency department at any time for further treatment.       MDM Rules/Calculators/A&P                       Final Clinical Impression(s) / ED Diagnoses Final diagnoses:  Acute nonintractable headache, unspecified headache type    Rx / DC Orders ED Discharge Orders    None       Guss Farruggia A, PA-C 11/19/19 2214    Little, Ambrose Finland, MD 11/19/19 2217

## 2019-11-21 ENCOUNTER — Emergency Department (HOSPITAL_COMMUNITY): Payer: Self-pay

## 2019-11-21 ENCOUNTER — Other Ambulatory Visit: Payer: Self-pay

## 2019-11-21 ENCOUNTER — Encounter (HOSPITAL_COMMUNITY): Payer: Self-pay | Admitting: Emergency Medicine

## 2019-11-21 ENCOUNTER — Emergency Department (HOSPITAL_COMMUNITY)
Admission: EM | Admit: 2019-11-21 | Discharge: 2019-11-21 | Disposition: A | Discharge status: Home / Self Care | Payer: Self-pay | Attending: Emergency Medicine | Admitting: Emergency Medicine

## 2019-11-21 DIAGNOSIS — F17210 Nicotine dependence, cigarettes, uncomplicated: Secondary | ICD-10-CM | POA: Insufficient documentation

## 2019-11-21 DIAGNOSIS — R519 Headache, unspecified: Secondary | ICD-10-CM | POA: Insufficient documentation

## 2019-11-21 LAB — COMPREHENSIVE METABOLIC PANEL
ALT: 18 U/L (ref 0–44)
AST: 20 U/L (ref 15–41)
Albumin: 4.2 g/dL (ref 3.5–5.0)
Alkaline Phosphatase: 38 U/L (ref 38–126)
Anion gap: 7 (ref 5–15)
BUN: 9 mg/dL (ref 6–20)
CO2: 29 mmol/L (ref 22–32)
Calcium: 8.7 mg/dL — ABNORMAL LOW (ref 8.9–10.3)
Chloride: 102 mmol/L (ref 98–111)
Creatinine, Ser: 0.89 mg/dL (ref 0.61–1.24)
GFR calc Af Amer: >60 mL/min (ref >60)
GFR calc non Af Amer: >60 mL/min (ref >60)
Glucose, Bld: 93 mg/dL (ref 70–99)
Potassium: 3.5 mmol/L (ref 3.5–5.1)
Sodium: 138 mmol/L (ref 135–145)
Total Bilirubin: 0.9 mg/dL (ref 0.3–1.2)
Total Protein: 7.4 g/dL (ref 6.5–8.1)

## 2019-11-21 LAB — CBC WITH DIFFERENTIAL/PLATELET
Abs Immature Granulocytes: 0.01 10*3/uL (ref 0.00–0.07)
Basophils Absolute: 0 10*3/uL (ref 0.0–0.1)
Basophils Relative: 1 %
Eosinophils Absolute: 0 10*3/uL (ref 0.0–0.5)
Eosinophils Relative: 0 %
HCT: 42.6 % (ref 39.0–52.0)
Hemoglobin: 14.5 g/dL (ref 13.0–17.0)
Immature Granulocytes: 0 %
Lymphocytes Relative: 25 %
Lymphs Abs: 1 10*3/uL (ref 0.7–4.0)
MCH: 30.9 pg (ref 26.0–34.0)
MCHC: 34 g/dL (ref 30.0–36.0)
MCV: 90.6 fL (ref 80.0–100.0)
Monocytes Absolute: 0.4 10*3/uL (ref 0.1–1.0)
Monocytes Relative: 12 %
Neutro Abs: 2.4 10*3/uL (ref 1.7–7.7)
Neutrophils Relative %: 62 %
Platelets: 222 10*3/uL (ref 150–400)
RBC: 4.7 MIL/uL (ref 4.22–5.81)
RDW: 12.9 % (ref 11.5–15.5)
WBC: 3.8 10*3/uL — ABNORMAL LOW (ref 4.0–10.5)
nRBC: 0 % (ref 0.0–0.2)

## 2019-11-21 MED ORDER — SODIUM CHLORIDE 0.9 % IV BOLUS
1000.0000 mL | Freq: Once | INTRAVENOUS | Status: AC
  Administered 2019-11-21: 13:00:00 1000 mL via INTRAVENOUS

## 2019-11-21 MED ORDER — KETOROLAC TROMETHAMINE 30 MG/ML IJ SOLN
30.0000 mg | Freq: Once | INTRAMUSCULAR | Status: AC
  Administered 2019-11-21: 15:00:00 30 mg via INTRAVENOUS
  Filled 2019-11-21: qty 1

## 2019-11-21 MED ORDER — METOCLOPRAMIDE HCL 5 MG/ML IJ SOLN
10.0000 mg | Freq: Once | INTRAMUSCULAR | Status: AC
  Administered 2019-11-21: 10 mg via INTRAVENOUS
  Filled 2019-11-21: qty 2

## 2019-11-21 MED ORDER — DIPHENHYDRAMINE HCL 25 MG PO CAPS
25.0000 mg | ORAL_CAPSULE | Freq: Once | ORAL | Status: AC
  Administered 2019-11-21: 25 mg via ORAL
  Filled 2019-11-21: qty 1

## 2019-11-21 MED ORDER — DEXAMETHASONE SODIUM PHOSPHATE 10 MG/ML IJ SOLN
8.0000 mg | Freq: Once | INTRAMUSCULAR | Status: AC
  Administered 2019-11-21: 15:00:00 8 mg via INTRAVENOUS
  Filled 2019-11-21: qty 1

## 2019-11-21 NOTE — Discharge Instructions (Addendum)
Please follow-up with neurology for your continued episodic headaches.  Please return to ED for any new or concerning symptoms.

## 2019-11-21 NOTE — ED Provider Notes (Signed)
[facility] EMERGENCY DEPARTMENT Provider Note   CSN: [id number] Arrival date & time: 11/21/19  6222     History Chief Complaint  Patient presents with   Headache    Joshua Joyce is a 29 y.o. male.  HPI  Patient is a 29 year old male with no pertinent past medical history presented today with right-sided temporal headache that is achy, intermittent, 5/10 nonradiating.  He states that is been ongoing for 1.5 weeks and states that it is improved with Tylenol PM.  He denies any aggravating factors.  States any progressive worsening.  Denies any nausea, vomiting, lightheadedness, visual changes, blurry vision, double vision.  I specifically inquired about any blurry vision as when he was seen at prior ED visits for the symptoms he did endorse this.  However he emphatically states that he has never had any blurry vision states that sometimes it hurts so much that he does not want to open his eye but has no other visual issues.  Patient denies any blood thinner use history of cancer, back or neck pain.    History reviewed. No pertinent past medical history.  Patient Active Problem List   Diagnosis Date Noted   DYSURIA 09/19/2007   KNEE PAIN, LEFT 07/17/2007   CHALAZION 09/14/2006    History reviewed. No pertinent surgical history.     Family History  Family history unknown: Yes    Social History   Tobacco Use   Smoking status: Former Smoker  Substance Use Topics   Alcohol use: Not on file   Drug use: Yes    Types: Marijuana    Home Medications Prior to Admission medications   Medication Sig Start Date End Date Taking? Authorizing Provider  acetaminophen (TYLENOL) 500 MG tablet Take 500 mg by mouth every 6 (six) hours as needed for mild pain.   Yes [provider]  doxycycline (ADOXA) 100 MG tablet Take 1 tablet by mouth two times a day for 7 days   05/17/19  [provider]    Allergies    Patient has no known  allergies.  Review of Systems   Review of Systems  Constitutional: Negative for chills and fever.  HENT: Negative for congestion.   Eyes: Negative for pain.  Respiratory: Negative for cough and shortness of breath.   Cardiovascular: Negative for chest pain and leg swelling.  Gastrointestinal: Negative for abdominal pain and vomiting.  Genitourinary: Negative for dysuria.  Musculoskeletal: Negative for myalgias.  Skin: Negative for rash.  Neurological: Positive for headaches. Negative for dizziness.    Physical Exam Updated Vital Signs BP 131/86 (BP Location: Left Arm)    Pulse (!) 54    Temp (!) 97.4 F (36.3 C) (Oral)    Resp 14    Ht 5\' 11"  (1.803 m)    Wt 86.2 kg    SpO2 100%    BMI 26.50 kg/m   Physical Exam Vitals and nursing note reviewed.  Constitutional:      General: He is not in acute distress. HENT:     Head: Normocephalic and atraumatic.     Comments: No tenderness palpation of the lumbar area bilaterally.    Nose: Nose normal.  Eyes:     General: No scleral icterus.    Extraocular Movements: Extraocular movements intact.     Comments: Right sclera with small subconjunctival hemorrhage  Cardiovascular:     Rate and Rhythm: Normal rate and regular rhythm.     Pulses: Normal pulses.  Heart sounds: Normal heart sounds.  Pulmonary:     Effort: Pulmonary effort is normal. No respiratory distress.     Breath sounds: No wheezing.  Abdominal:     Palpations: Abdomen is soft.     Tenderness: There is no abdominal tenderness.  Musculoskeletal:     Cervical back: Normal range of motion.     Right lower leg: No edema.     Left lower leg: No edema.  Skin:    General: Skin is warm and dry.     Capillary Refill: Capillary refill takes less than 2 seconds.  Neurological:     Mental Status: He is alert. Mental status is at baseline.     Comments: Alert and oriented to self, place, time and event.   Speech is fluent, clear without dysarthria or dysphasia.    Strength 5/5 in upper/lower extremities  Sensation intact in upper/lower extremities   Normal gait.  Negative Romberg. No pronator drift.  Normal finger-to-nose and feet tapping.  CN I not tested  CN II grossly intact visual fields bilaterally. Did not visualize posterior eye.   CN III, IV, VI PERRLA and EOMs intact bilaterally  CN V Intact sensation to sharp and light touch to the face  CN VII facial movements symmetric  CN VIII not tested  CN IX, X no uvula deviation, symmetric rise of soft palate  CN XI 5/5 SCM and trapezius strength bilaterally  CN XII Midline tongue protrusion, symmetric L/R movements   Psychiatric:        Mood and Affect: Mood normal.        Behavior: Behavior normal.     ED Results / Procedures / Treatments   Labs (all labs ordered are listed, but only abnormal results are displayed) Labs Reviewed  COMPREHENSIVE METABOLIC PANEL - Abnormal; Notable for the following components:      Result Value   Calcium 8.7 (*)    All other components within normal limits  CBC WITH DIFFERENTIAL/PLATELET - Abnormal; Notable for the following components:   WBC 3.8 (*)    All other components within normal limits    EKG None  Radiology CT Head Wo Contrast  Result Date: 11/21/2019 CLINICAL DATA:  29 year old male with history of headache. Suspected acute intracranial hemorrhage. EXAM: CT HEAD WITHOUT CONTRAST TECHNIQUE: Contiguous axial images were obtained from the base of the skull through the vertex without intravenous contrast. COMPARISON:  None. FINDINGS: Brain: No evidence of acute infarction, hemorrhage, hydrocephalus, extra-axial collection or mass lesion/mass effect. Vascular: No hyperdense vessel or unexpected calcification. Skull: Normal. Negative for fracture or focal lesion. Sinuses/Orbits: No acute finding. Other: None. IMPRESSION: 1. No acute intracranial abnormalities. The appearance of the brain is normal. Electronically Signed   By: Vinnie Langton  M.D.   On: 11/21/2019 14:21    Procedures Procedures (including critical care time)  Medications Ordered in ED Medications  metoCLOPramide (REGLAN) injection 10 mg (10 mg Intravenous Given 11/21/19 1242)  diphenhydrAMINE (BENADRYL) capsule 25 mg (25 mg Oral Given 11/21/19 1241)  sodium chloride 0.9 % bolus 1,000 mL (0 mLs Intravenous Stopped 11/21/19 1458)  ketorolac (TORADOL) 30 MG/ML injection 30 mg (30 mg Intravenous Given 11/21/19 1457)  dexamethasone (DECADRON) injection 8 mg (8 mg Intravenous Given 11/21/19 1458)    ED Course  I have reviewed the triage vital signs and the nursing notes.  Pertinent labs & imaging results that were available during my care of the patient were reviewed by me and considered in my medical  decision making (see chart for details).    MDM Rules/Calculators/A&P                      Patient is a 29 year old male with no pertinent past medical history presented today for headache.  He states he has had intermittent headaches for the past 1.5 weeks.  He denies any history of headaches but is on further questioning states that he does occasionally get headaches.  He denies any visual symptoms although he does endorse this in the past.  He has come to emergency department several times and left AMA or without being seen.  Today he states that he is willing to be fully evaluated.  He is requesting a CT of his head.   This patient has a new onset HAs.  We will obtain basic labs and head CT.  Will provide patient with Benadryl and Reglan while CT is pending.  Emergent considerations for headache include subarachnoid hemorrhage, meningitis, temporal arteritis, glaucoma, cerebral ischemia, carotid/vertebral dissection, intracranial tumor, Venous sinus thrombosis, carbon monoxide poisoning, acute or chronic subdural hemorrhage.  Other considerations include: Migraine, Cluster headache, Hypertension, Caffeine, alcohol, or drug withdrawal, Pseudotumor cerebri, Arteriovenous  malformation, Head injury, Neurocysticercosis, Post-lumbar puncture, Preeclampsia, Tension headache, Sinusitis, Cervical arthritis, Refractive error causing strain, Dental abscess, Otitis media, Temporomandibular joint syndrome, Depression, Somatoform disorder (eg, somatization) Trigeminal neuralgia, Glossopharyngeal neuralgia.  Low suspicion for temporal arteritis.  Patient has no jaw claudication, is 29 years old has no prior medical conditions.  Also denies any visual symptoms although he has endorses in the past with different providers but emphatically denies this today.   Patient given neurology follow-up he will follow up with them in clinic.  Given ketorolac and Decadron.   Final Clinical Impression(s) / ED Diagnoses Final diagnoses:  Nonintractable episodic headache, unspecified headache type    Rx / DC Orders ED Discharge Orders    None       Gailen Shelter, [place] 11/21/19 1616    Terald Sleeper, MD 11/21/19 [phone]

## 2019-11-21 NOTE — ED Triage Notes (Signed)
Pt reports R sided headache that he has had for 1 week, no other associated symptoms, here in 5/4 for the same but LWBS. Taking tylenol pm with some relief.

## 2019-11-21 NOTE — ED Notes (Signed)
-  PT reports left side headache that started a week and half -He reports a red spot appeared on his left eye about 3 days ago. -He says the headache wakes him up at night -Reports pain as throbbing -He reports taking tylenol PM at nights

## 2019-11-21 NOTE — ED Notes (Signed)
Pt verbalized understanding of discharge instructions. Follow up care reviewed, pt had no further questions and ambulated independently to lobby.

## 2019-11-25 ENCOUNTER — Encounter (HOSPITAL_COMMUNITY): Payer: Self-pay | Admitting: Emergency Medicine

## 2019-11-25 ENCOUNTER — Emergency Department (HOSPITAL_COMMUNITY)
Admission: EM | Admit: 2019-11-25 | Discharge: 2019-11-25 | Disposition: A | Discharge status: Home / Self Care | Payer: Self-pay | Attending: Emergency Medicine | Admitting: Emergency Medicine

## 2019-11-25 ENCOUNTER — Other Ambulatory Visit: Payer: Self-pay

## 2019-11-25 DIAGNOSIS — G44009 Cluster headache syndrome, unspecified, not intractable: Secondary | ICD-10-CM

## 2019-11-25 DIAGNOSIS — G43009 Migraine without aura, not intractable, without status migrainosus: Secondary | ICD-10-CM | POA: Insufficient documentation

## 2019-11-25 DIAGNOSIS — Z20822 Contact with and (suspected) exposure to covid-19: Secondary | ICD-10-CM | POA: Insufficient documentation

## 2019-11-25 LAB — RESPIRATORY PANEL BY RT PCR (FLU A&B, COVID)
Influenza A by PCR: NEGATIVE
Influenza B by PCR: NEGATIVE
SARS Coronavirus 2 by RT PCR: NEGATIVE

## 2019-11-25 MED ORDER — NAPROXEN 500 MG PO TABS: 500.0000 mg | ORAL_TABLET | Freq: Two times a day (BID) | ORAL | 0 refills | Status: AC

## 2019-11-25 MED ORDER — KETOROLAC TROMETHAMINE 60 MG/2ML IM SOLN
60.0000 mg | Freq: Once | INTRAMUSCULAR | Status: AC
  Administered 2019-11-25: 60 mg via INTRAMUSCULAR
  Filled 2019-11-25: qty 2

## 2019-11-25 MED ORDER — METHYLPREDNISOLONE 4 MG PO TBPK: ORAL_TABLET | ORAL | 0 refills | Status: AC

## 2019-11-25 MED ORDER — NAPROXEN 500 MG PO TABS: 500.0000 mg | ORAL_TABLET | Freq: Two times a day (BID) | ORAL | 0 refills | Status: DC

## 2019-11-25 MED ORDER — METHYLPREDNISOLONE 4 MG PO TBPK: ORAL_TABLET | ORAL | 0 refills | Status: DC

## 2019-11-25 MED ORDER — DEXAMETHASONE SODIUM PHOSPHATE 10 MG/ML IJ SOLN
10.0000 mg | Freq: Once | INTRAMUSCULAR | Status: AC
  Administered 2019-11-25: 10 mg via INTRAMUSCULAR
  Filled 2019-11-25: qty 1

## 2019-11-25 NOTE — ED Provider Notes (Signed)
[facility] DEPART[facility] Provider Note   CSN: [id number] Arrival date & time: 11/25/19  0359     History Chief Complaint  Patient presents with  . Headache    Joshua Joyce is a 29 y.o. male headache x 1 week. sharp and stabbing on R side, never had headaches before. 3 and 4 days ago with normal work up, negative CT. No nausea or vomiting, Pain improved with tylenol and dark room.  No hx of headaches. Headaches come on and reach severe intensity 0 lasting approximately 1 hour at a time. Centered around the R eye, has lacrimation from the left eye. No fhx of cluster headaches or migraines. Denies  phonophobia,  throbbing,visual changes, stiff neck, neck pain, rash, or "thunderclap" onset.     HPI     History reviewed. No pertinent past medical history.  There are no problems to display for this patient.   History reviewed. No pertinent surgical history.     Family History  Family history unknown: Yes    Social History   Tobacco Use  . Smoking status: Never Smoker  . Smokeless tobacco: Never Used  Substance Use Topics  . Alcohol use: Yes  . Drug use: Never    Home Medications Prior to Admission medications   Not on File    Allergies    Patient has no known allergies.  Review of Systems   Review of Systems Ten systems reviewed and are negative for acute change, except as noted in the HPI.  Physical Exam Updated Vital Signs BP 128/81 (BP Location: Right Arm)   Pulse 63   Temp 98.7 F (37.1 C) (Oral)   Resp 18   Wt 72.6 kg   SpO2 99%   Physical Exam Physical Exam  Constitutional: Pt is oriented to person, place, and time. Pt appears well-developed and well-nourished. No distress.  HENT:  Head: Normocephalic and atraumatic.  Mouth/Throat: Oropharynx is clear and moist.  Eyes: Conjunctivae and EOM are normal. Pupils are equal, round, and reactive to light. No scleral icterus.  No horizontal, vertical or rotational  nystagmus  Neck: Normal range of motion. Neck supple.  Full active and passive ROM without pain No midline or paraspinal tenderness No nuchal rigidity or meningeal signs  Cardiovascular: Normal rate, regular rhythm and intact distal pulses.   Pulmonary/Chest: Effort normal and breath sounds normal. No respiratory distress. Pt has no wheezes. No rales.  Abdominal: Soft. Bowel sounds are normal. There is no tenderness. There is no rebound and no guarding.  Musculoskeletal: Normal range of motion.  Lymphadenopathy:    No cervical adenopathy.  Neurological: Pt. is alert and oriented to person, place, and time. He has normal reflexes. No cranial nerve deficit.  Exhibits normal muscle tone. Coordination normal.  Mental Status:  Alert, oriented, thought content appropriate. Speech fluent without evidence of aphasia. Able to follow 2 step commands without difficulty.  Cranial Nerves:  II:  Peripheral visual fields grossly normal, pupils equal, round, reactive to light III,IV, VI: ptosis not present, extra-ocular motions intact bilaterally  V,VII: smile symmetric, facial light touch sensation equal VIII: hearing grossly normal bilaterally  IX,X: midline uvula rise  XI: bilateral shoulder shrug equal and strong XII: midline tongue extension  Motor:  5/5 in upper and lower extremities bilaterally including strong and equal grip strength and dorsiflexion/plantar flexion Sensory: Pinprick and light touch normal in all extremities.  Deep Tendon Reflexes: 2+ and symmetric  Cerebellar: normal finger-to-nose with bilateral upper extremities Gait: normal  gait and balance CV: distal pulses palpable throughout   Skin: Skin is warm and dry. No rash noted. Pt is not diaphoretic.  Psychiatric: Pt has a normal mood and affect. Behavior is normal. Judgment and thought content normal.  Nursing note and vitals reviewed.   ED Results / Procedures / Treatments   Labs (all labs ordered are listed, but only  abnormal results are displayed) Labs Reviewed  RESPIRATORY PANEL BY RT PCR (FLU A&B, COVID)    EKG None  Radiology No results found.  Procedures Procedures (including critical care time)  Medications Ordered in ED Medications - No data to display  ED Course  I have reviewed the triage vital signs and the nursing notes.  Pertinent labs & imaging results that were available during my care of the patient were reviewed by me and considered in my medical decision making (see chart for details).    MDM Rules/Calculators/A&P                      Patient here with headache. Most  Likely cluster based on history. Jachob Mcclean presents with headache. EMR reviewed and shows negative CT. Neuro exam is normal- doubt space containing lesion or other emergent cause. Will treat with decadron and toradol here. D/c with naproxen and medrol taper. Close f/u with neurology. Given the large differential diagnosis for Hanson Medeiros, the decision making in this case is of high complexity.  After evaluating all of the data points in this case, the presentation of Timber Lucarelli is NOT consistent with skull fracture, meningitis/encephalitis, SAH/sentinel bleed, Intracranial Hemorrhage (ICH) (subdural/epidural), acute obstructive hydrocephalus, space occupying lesions, CVA, CO Poisoning, Basilar/vertebral artery dissection, preeclampsia, cerebral venous thrombosis, hypertensive emergency, temporal Arteritis, Idiopathic Intracranial Hypertension (pseudotumor cerebri).  Strict return and follow-up precautions have been given by me personally or by detailed written instructions verbalized by nursing staff using the teach back method to patient/family/caregiver.  Data Reviewed/Counseling: I have reviewed the patient's vital signs, nursing notes, and other relevant tests/information. I had a detailed discussion regarding the historical points, exam findings, and any diagnostic results supporting the  discharge diagnosis. I also discussed the need for outpatient follow-up and the need to return to the ED if symptoms worsen or if there are any questions or concerns that arise at [facility]  Final Clinical Impression(s) / ED Diagnoses Final diagnoses:  None    Rx / DC Orders ED Discharge Orders    None       Arthor Captain, PA-C 11/25/19 0745    Pricilla Loveless, MD 11/25/19 [phone]

## 2019-11-25 NOTE — Discharge Instructions (Addendum)
1- PLEASE READ THE ATTACHMENT ABOUT CLUSTER HEADACHES! IT IS VERY IMPORTANT THAT YOU UNDERSTAND THE DIAGNOSIS AND MAKE THE LIFESTYLE MODIFICATIONS THAT WILL HELP TREAT AND PREVENT THESE HEADACHES.   2- IT IS ALSO VERY IMPORTANT TO FOLLOW UP WITH A NEUROLOGIST WHO CAN HELP YOU WITH LONG TERM MANAGEMENT OF CLUSTER HEADACHES BECAUSE THESE TEND TO RECUR.  3- PLEASE TAKE THE MEDICATIONS I HAVE PRESCRIBED.  Get help right away if: You faint. You have weakness or numbness, especially on one side of your body or face. You have double vision. You have nausea or vomiting that does not go away within several hours. You have trouble talking, walking, or keeping your balance. You have pain or stiffness in your neck. You have a fever.

## 2019-11-25 NOTE — ED Triage Notes (Signed)
Pt in w/R sided HA x 1.5 wk, also c/o weakness, "hot all over". No fever, denies any sick contacts, cp or n/v.

## 2019-11-25 NOTE — ED Notes (Signed)
Pt verbalizes understanding of d/c instructions. Prescriptions reviewed with patient. Pt ambulatory at d/c with all belongings.  

## 2019-12-05 ENCOUNTER — Emergency Department (HOSPITAL_COMMUNITY)
Admission: EM | Admit: 2019-12-05 | Discharge: 2019-12-05 | Disposition: A | Discharge status: Home / Self Care | Payer: Self-pay | Attending: Emergency Medicine | Admitting: Emergency Medicine

## 2019-12-05 ENCOUNTER — Other Ambulatory Visit: Payer: Self-pay

## 2019-12-05 DIAGNOSIS — R519 Headache, unspecified: Secondary | ICD-10-CM | POA: Insufficient documentation

## 2019-12-05 DIAGNOSIS — Z5321 Procedure and treatment not carried out due to patient leaving prior to being seen by health care provider: Secondary | ICD-10-CM | POA: Insufficient documentation

## 2019-12-05 NOTE — ED Notes (Signed)
Called for pt x3, no response. 

## 2019-12-05 NOTE — ED Triage Notes (Signed)
Pt presents to ED POv. Pt c/o headache on R side, photosensitivity. Pt says he was recently seen for same. Given prescriptions that did not help.

## 2020-04-02 ENCOUNTER — Other Ambulatory Visit: Payer: Self-pay

## 2020-04-02 ENCOUNTER — Encounter (HOSPITAL_COMMUNITY): Payer: Self-pay | Admitting: Emergency Medicine

## 2020-04-02 ENCOUNTER — Ambulatory Visit (HOSPITAL_COMMUNITY): Admission: EM | Admit: 2020-04-02 | Discharge: 2020-04-02 | Discharge status: Against Medical Advice | Payer: Self-pay

## 2020-04-02 NOTE — ED Triage Notes (Signed)
Pt presents for STD testing, he states he had unprotected sex about 10 days ago and his partner states she is having symptoms of burning with urination and itching. Pt is requesting HIV and herpes testing. Pt states he is having a stinging sensation with urination.

## 2020-04-02 NOTE — ED Notes (Signed)
No answer in lobby.

## 2020-04-02 NOTE — ED Notes (Signed)
2nd attempt to contact patient , no answer.
